# Patient Record
Sex: Male | Born: 2000 | Race: White | Hispanic: No | Marital: Single | Smoking: Current every day smoker
Health system: Southern US, Community
[De-identification: ages and names within clinical notes are randomized; demographics above are authoritative.]

## PROBLEM LIST (undated history)

## (undated) DIAGNOSIS — R569 Unspecified convulsions: Secondary | ICD-10-CM

## (undated) DIAGNOSIS — N289 Disorder of kidney and ureter, unspecified: Secondary | ICD-10-CM

---
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
## 2023-05-25 ENCOUNTER — Other Ambulatory Visit (HOSPITAL_COMMUNITY): Payer: Self-pay

## 2023-05-25 ENCOUNTER — Emergency Department (HOSPITAL_COMMUNITY): Payer: Medicaid Other

## 2023-05-25 ENCOUNTER — Telehealth: Payer: Self-pay | Admitting: *Deleted

## 2023-05-25 ENCOUNTER — Other Ambulatory Visit: Payer: Self-pay

## 2023-05-25 ENCOUNTER — Encounter (HOSPITAL_COMMUNITY): Payer: Self-pay | Admitting: *Deleted

## 2023-05-25 ENCOUNTER — Emergency Department (HOSPITAL_COMMUNITY)
Admission: EM | Admit: 2023-05-25 | Discharge: 2023-05-25 | Disposition: A | Discharge status: Home / Self Care | Payer: Medicaid Other | Source: Home / Self Care | Attending: Emergency Medicine | Admitting: Emergency Medicine

## 2023-05-25 DIAGNOSIS — N132 Hydronephrosis with renal and ureteral calculous obstruction: Secondary | ICD-10-CM | POA: Diagnosis not present

## 2023-05-25 DIAGNOSIS — N200 Calculus of kidney: Secondary | ICD-10-CM

## 2023-05-25 DIAGNOSIS — R109 Unspecified abdominal pain: Secondary | ICD-10-CM | POA: Diagnosis present

## 2023-05-25 LAB — CBC
HCT: 47.8 % (ref 39.0–52.0)
Hemoglobin: 16.7 g/dL (ref 13.0–17.0)
MCH: 30.1 pg (ref 26.0–34.0)
MCHC: 34.9 g/dL (ref 30.0–36.0)
MCV: 86.3 fL (ref 80.0–100.0)
Platelets: 308 10*3/uL (ref 150–400)
RBC: 5.54 MIL/uL (ref 4.22–5.81)
RDW: 11.9 % (ref 11.5–15.5)
WBC: 6.4 10*3/uL (ref 4.0–10.5)
nRBC: 0 % (ref 0.0–0.2)

## 2023-05-25 LAB — BASIC METABOLIC PANEL
Anion gap: 12 (ref 5–15)
BUN: 10 mg/dL (ref 6–20)
CO2: 23 mmol/L (ref 22–32)
Calcium: 9.2 mg/dL (ref 8.9–10.3)
Chloride: 102 mmol/L (ref 98–111)
Creatinine, Ser: 1.06 mg/dL (ref 0.61–1.24)
GFR, Estimated: >60 mL/min (ref >60)
Glucose, Bld: 144 mg/dL — ABNORMAL HIGH (ref 70–99)
Potassium: 3.1 mmol/L — ABNORMAL LOW (ref 3.5–5.1)
Sodium: 137 mmol/L (ref 135–145)

## 2023-05-25 LAB — URINALYSIS, ROUTINE W REFLEX MICROSCOPIC
Bilirubin Urine: NEGATIVE
Glucose, UA: NEGATIVE mg/dL
Ketones, ur: NEGATIVE mg/dL
Leukocytes,Ua: NEGATIVE
Nitrite: NEGATIVE
Protein, ur: 30 mg/dL — AB
RBC / HPF: >50 RBC/hpf (ref 0–5)
Specific Gravity, Urine: 1.025 (ref 1.005–1.030)
pH: 6 (ref 5.0–8.0)

## 2023-05-25 LAB — HEPATIC FUNCTION PANEL
ALT: 13 U/L (ref 0–44)
AST: 19 U/L (ref 15–41)
Albumin: 4.3 g/dL (ref 3.5–5.0)
Alkaline Phosphatase: 58 U/L (ref 38–126)
Bilirubin, Direct: 0.2 mg/dL (ref 0.0–0.2)
Indirect Bilirubin: 0.5 mg/dL (ref 0.3–0.9)
Total Bilirubin: 0.7 mg/dL (ref 0.3–1.2)
Total Protein: 7.2 g/dL (ref 6.5–8.1)

## 2023-05-25 LAB — LIPASE, BLOOD: Lipase: 43 U/L (ref 11–51)

## 2023-05-25 MED ORDER — HYDROMORPHONE HCL 1 MG/ML IJ SOLN
1.0000 mg | Freq: Once | INTRAMUSCULAR | Status: AC
  Administered 2023-05-25: 1 mg via INTRAVENOUS
  Filled 2023-05-25: qty 1

## 2023-05-25 MED ORDER — ONDANSETRON 8 MG PO TBDP
8.0000 mg | ORAL_TABLET | Freq: Three times a day (TID) | ORAL | 0 refills | Status: AC | PRN
  Filled 2023-05-25: qty 12, 4d supply, fill #0

## 2023-05-25 MED ORDER — KETOROLAC TROMETHAMINE 30 MG/ML IJ SOLN
30.0000 mg | Freq: Once | INTRAMUSCULAR | Status: AC
  Administered 2023-05-25: 30 mg via INTRAVENOUS
  Filled 2023-05-25: qty 1

## 2023-05-25 MED ORDER — ONDANSETRON 8 MG PO TBDP
8.0000 mg | ORAL_TABLET | Freq: Three times a day (TID) | ORAL | 0 refills | Status: DC | PRN
  Filled 2023-05-25: qty 12, 4d supply, fill #0

## 2023-05-25 MED ORDER — NAPROXEN 375 MG PO TABS
375.0000 mg | ORAL_TABLET | Freq: Two times a day (BID) | ORAL | 0 refills | Status: AC
  Filled 2023-05-25: qty 20, 10d supply, fill #0

## 2023-05-25 MED ORDER — HYDROCODONE-ACETAMINOPHEN 5-325 MG PO TABS
1.0000 | ORAL_TABLET | Freq: Four times a day (QID) | ORAL | 0 refills | Status: DC | PRN
  Filled 2023-05-25: qty 12, 3d supply, fill #0

## 2023-05-25 MED ORDER — SODIUM CHLORIDE 0.9 % IV BOLUS
500.0000 mL | Freq: Once | INTRAVENOUS | Status: AC
  Administered 2023-05-25: 500 mL via INTRAVENOUS

## 2023-05-25 MED ORDER — NAPROXEN 375 MG PO TABS
375.0000 mg | ORAL_TABLET | Freq: Two times a day (BID) | ORAL | 0 refills | Status: DC
  Filled 2023-05-25: qty 20, 10d supply, fill #0

## 2023-05-25 MED ORDER — HYDROCODONE-ACETAMINOPHEN 5-325 MG PO TABS: 1.0000 | ORAL_TABLET | Freq: Four times a day (QID) | ORAL | 0 refills | Status: DC | PRN

## 2023-05-25 MED ORDER — NAPROXEN 375 MG PO TABS: 375.0000 mg | ORAL_TABLET | Freq: Two times a day (BID) | ORAL | 0 refills | Status: DC

## 2023-05-25 MED ORDER — ONDANSETRON HCL 4 MG/2ML IJ SOLN
4.0000 mg | Freq: Once | INTRAMUSCULAR | Status: AC
  Administered 2023-05-25: 4 mg via INTRAVENOUS
  Filled 2023-05-25: qty 2

## 2023-05-25 MED ORDER — HYDROCODONE-ACETAMINOPHEN 5-325 MG PO TABS
1.0000 | ORAL_TABLET | Freq: Four times a day (QID) | ORAL | 0 refills | Status: AC | PRN
Start: 2023-05-25 — End: ?
  Filled 2023-05-25: qty 12, 3d supply, fill #0

## 2023-05-25 MED ORDER — HYDROCODONE-ACETAMINOPHEN 5-325 MG PO TABS: 1.0000 | ORAL_TABLET | Freq: Four times a day (QID) | ORAL | 0 refills | Status: AC | PRN

## 2023-05-25 MED ORDER — ONDANSETRON 8 MG PO TBDP: 8.0000 mg | ORAL_TABLET | Freq: Three times a day (TID) | ORAL | 0 refills | Status: DC | PRN

## 2023-05-25 MED ORDER — SODIUM CHLORIDE 0.9 % IV SOLN: INTRAVENOUS | Status: DC

## 2023-05-25 NOTE — Telephone Encounter (Signed)
Unable to pick up rx at [facility].  [facility]

## 2023-05-25 NOTE — Discharge Instructions (Addendum)
The CT scan did show that you have a kidney stone.  It is 1 to 2 mm in size.  This should pass on its own.  Follow-up with a urologist if the pain does not resolve.  Return to the ER as needed for recurrent symptoms fevers chills or other concerning symptoms

## 2023-05-25 NOTE — ED Provider Notes (Signed)
[place] EMERGENCY DEPARTMENT AT [facility] Provider Note   CSN: [id number] Arrival date & time: 05/25/23  4098     History  Chief Complaint  Patient presents with   Flank Pain    Nicholas Lane is a 22 y.o. male.   Flank Pain     Patient has a history of seizures kidney stones who presents ED with complaints abdominal pain flank pain.  Patient states his symptoms started this morning.  He is having severe pain in his back more on the left side but also in his abdomen.  It seems to radiate towards his lower abdomen.  He has noted some discoloration and blood to his urine recently.  He denies any fevers.  No recent falls or injuries.  This does feel similar to previous kidney stones  Home Medications Prior to Admission medications   Medication Sig Start Date End Date Taking? Authorizing Provider  HYDROcodone-acetaminophen (NORCO/VICODIN) 5-325 MG tablet Take 1 tablet by mouth every 6 (six) hours as needed. 05/25/23  Yes Linwood Dibbles, MD  ibuprofen (ADVIL) 200 MG tablet Take 400 mg by mouth as needed for moderate pain.   Yes [provider]  naproxen (NAPROSYN) 375 MG tablet Take 1 tablet (375 mg total) by mouth 2 (two) times daily. 05/25/23  Yes Linwood Dibbles, MD  ondansetron (ZOFRAN-ODT) 8 MG disintegrating tablet Take 1 tablet (8 mg total) by mouth every 8 (eight) hours as needed for nausea or vomiting. 05/25/23  Yes Linwood Dibbles, MD      Allergies    Strawberry (diagnostic), Fluconazole, Nystatin, and Penicillins    Review of Systems   Review of Systems  Genitourinary:  Positive for flank pain.    Physical Exam Updated Vital Signs BP (!) 135/92 (BP Location: Right Arm)   Pulse 76   Temp 97.6 F (36.4 C) (Oral)   Resp 20   Ht 1.829 m (6')   Wt 59 kg   SpO2 100%   BMI 17.63 kg/m  Physical Exam Vitals and nursing note reviewed.  Constitutional:      Appearance: Normal appearance. He is well-developed. He is ill-appearing.  HENT:     Head:  Normocephalic and atraumatic. No raccoon eyes or Battle's sign.     Right Ear: External ear normal.     Left Ear: External ear normal.  Eyes:     General: Lids are normal.        Right eye: No discharge.     Conjunctiva/sclera:     Right eye: No hemorrhage.    Left eye: No hemorrhage. Neck:     Trachea: No tracheal deviation.  Cardiovascular:     Rate and Rhythm: Normal rate and regular rhythm.     Heart sounds: Normal heart sounds.  Pulmonary:     Effort: Pulmonary effort is normal. No respiratory distress.     Breath sounds: Normal breath sounds. No stridor.  Chest:     Chest wall: No tenderness.  Abdominal:     General: Bowel sounds are normal. There is no distension.     Palpations: Abdomen is soft. There is no mass.     Tenderness: There is abdominal tenderness. There is left CVA tenderness.     Comments: Negative for seat belt sign  Musculoskeletal:     Cervical back: No swelling, edema, deformity or tenderness. No spinous process tenderness.     Thoracic back: No swelling, deformity or tenderness.     Lumbar back: No swelling or tenderness.  Comments: Pelvis stable, no ttp  Neurological:     Mental Status: He is alert.     GCS: GCS eye subscore is 4. GCS verbal subscore is 5. GCS motor subscore is 6.     Sensory: No sensory deficit.     Motor: No abnormal muscle tone.     Comments: Able to move all extremities, sensation intact throughout  Psychiatric:        Mood and Affect: Mood normal.        Speech: Speech normal.        Behavior: Behavior normal.     ED Results / Procedures / Treatments   Labs (all labs ordered are listed, but only abnormal results are displayed) Labs Reviewed  BASIC METABOLIC PANEL - Abnormal; Notable for the following components:      Result Value   Potassium 3.1 (*)    Glucose, Bld 144 (*)    All other components within normal limits  URINALYSIS, ROUTINE W REFLEX MICROSCOPIC - Abnormal; Notable for the following components:    APPearance HAZY (*)    Hgb urine dipstick LARGE (*)    Protein, ur 30 (*)    Bacteria, UA RARE (*)    All other components within normal limits  CBC  LIPASE, BLOOD  HEPATIC FUNCTION PANEL    EKG None  Radiology CT RENAL STONE STUDY  Result Date: 05/25/2023 CLINICAL DATA:  Flank pain.  Nausea and vomiting.  Nephrolithiasis. EXAM: CT ABDOMEN AND PELVIS WITHOUT CONTRAST TECHNIQUE: Multidetector CT imaging of the abdomen and pelvis was performed following the standard protocol without IV contrast. RADIATION DOSE REDUCTION: This exam was performed according to the departmental dose-optimization program which includes automated exposure control, adjustment of the mA and/or kV according to patient size and/or use of iterative reconstruction technique. COMPARISON:  03/24/2019 FINDINGS: Lower chest: No acute findings. Hepatobiliary: No mass visualized on this unenhanced exam. Gallbladder is unremarkable. No evidence of biliary ductal dilatation. Pancreas: No mass or inflammatory process visualized on this unenhanced exam. Spleen:  Within normal limits in size. Adrenals/Urinary tract: New mild left hydroureteronephrosis is seen. A new tiny 1-2 mm calcification is seen on image 68/3, likely within the distal left ureter. Stomach/Bowel: No evidence of obstruction, inflammatory process, or abnormal fluid collections. Normal appendix visualized. Vascular/Lymphatic: No pathologically enlarged lymph nodes identified. No evidence of abdominal aortic aneurysm. Reproductive:  No mass or other significant abnormality. Other:  None. Musculoskeletal:  No suspicious bone lesions identified. IMPRESSION: New mild left hydroureteronephrosis, with probable tiny 1-2 mm distal left ureteral calculus. Electronically Signed   By: Danae Orleans M.D.   On: 05/25/2023 10:25    Procedures Procedures    Medications Ordered in ED Medications  sodium chloride 0.9 % bolus 500 mL (500 mLs Intravenous New Bag/Given 05/25/23 0918)     And  0.9 %  sodium chloride infusion (has no administration in time range)  HYDROmorphone (DILAUDID) injection 1 mg (1 mg Intravenous Given 05/25/23 0912)  ondansetron (ZOFRAN) injection 4 mg (4 mg Intravenous Given 05/25/23 0911)  ketorolac (TORADOL) 30 MG/ML injection 30 mg (30 mg Intravenous Given 05/25/23 1004)    ED Course/ Medical Decision Making/ A&P Clinical Course as of 05/25/23 1048  Fri May 25, 2023  0933 Pain persists despite IV Dilaudid.  Will order dose of Toradol.  Urinalysis noted for large hematuria [JK]  1044 Patient is pain has resolved. [JK]    Clinical Course User Index [JK] Linwood Dibbles, MD  Medical Decision Making Differential diagnosis includes but not limited to renal colic, perforated viscus, diverticulitis or pancreatitis  Problems Addressed: Kidney stone: acute illness or injury that poses a threat to life or bodily functions  Amount and/or Complexity of Data Reviewed Labs: ordered. Decision-making details documented in ED Course. Radiology: ordered and independent interpretation performed.  Risk Prescription drug management. Parenteral controlled substances.   Patient presented with acute flank and abdominal pain.  Patient does have prior history of kidney stones.  Presentation was suggestive of renal colic.  Patient's urinalysis showed hematuria.  Not suggestive of infection.  No signs of pancreatitis or hepatitis.  CT scan does show a ureteral stone causing mild hydronephrosis.  Patient was treated with Dilaudid and Toradol with subsequent resolution of his symptoms.  Will plan on discharge home with medications for pain and nausea.  Outpatient follow-up with urology as needed        Final Clinical Impression(s) / ED Diagnoses Final diagnoses:  Kidney stone    Rx / DC Orders ED Discharge Orders          Ordered    HYDROcodone-acetaminophen (NORCO/VICODIN) 5-325 MG tablet  Every 6 hours PRN        05/25/23 1043     naproxen (NAPROSYN) 375 MG tablet  2 times daily        05/25/23 1043    ondansetron (ZOFRAN-ODT) 8 MG disintegrating tablet  Every 8 hours PRN        05/25/23 1043              Linwood Dibbles, MD 05/25/23 1048

## 2023-05-25 NOTE — Telephone Encounter (Signed)
Pt called to to have Rx changed to another location as the pharmacy it was sent to does not have Rx in stock. RNCM notified to change pharmacies.

## 2023-05-25 NOTE — ED Triage Notes (Signed)
Patient presents to ed via [facility] states he was awaken by right flank pain c/o nausea vomiting. Hx. Of kidney stones.
